# Patient Record
Sex: Female | Born: 1995 | Race: Black or African American | Hispanic: No | Marital: Single | Smoking: Never smoker
Health system: Southern US, Community
[De-identification: ages and names within clinical notes are randomized; demographics above are authoritative.]

---

## 2017-11-25 ENCOUNTER — Emergency Department (HOSPITAL_COMMUNITY)
Admission: EM | Admit: 2017-11-25 | Discharge: 2017-11-25 | Disposition: A | Discharge status: Home / Self Care | Payer: Self-pay | Attending: Emergency Medicine | Admitting: Emergency Medicine

## 2017-11-25 ENCOUNTER — Encounter (HOSPITAL_COMMUNITY): Payer: Self-pay | Admitting: *Deleted

## 2017-11-25 ENCOUNTER — Emergency Department (HOSPITAL_COMMUNITY): Payer: Self-pay

## 2017-11-25 DIAGNOSIS — R112 Nausea with vomiting, unspecified: Secondary | ICD-10-CM | POA: Insufficient documentation

## 2017-11-25 DIAGNOSIS — R1013 Epigastric pain: Secondary | ICD-10-CM | POA: Insufficient documentation

## 2017-11-25 DIAGNOSIS — R000 Tachycardia, unspecified: Secondary | ICD-10-CM | POA: Insufficient documentation

## 2017-11-25 DIAGNOSIS — R0602 Shortness of breath: Secondary | ICD-10-CM | POA: Insufficient documentation

## 2017-11-25 LAB — CBC WITH DIFFERENTIAL/PLATELET
Basophils Absolute: 0 10*3/uL (ref 0.0–0.1)
Basophils Relative: 0 %
Eosinophils Absolute: 0 10*3/uL (ref 0.0–0.7)
Eosinophils Relative: 0 %
HCT: 39.7 % (ref 36.0–46.0)
Hemoglobin: 14 g/dL (ref 12.0–15.0)
Lymphocytes Relative: 7 %
Lymphs Abs: 0.7 10*3/uL (ref 0.7–4.0)
MCH: 30.8 pg (ref 26.0–34.0)
MCHC: 35.3 g/dL (ref 30.0–36.0)
MCV: 87.4 fL (ref 78.0–100.0)
Monocytes Absolute: 0.2 10*3/uL (ref 0.1–1.0)
Monocytes Relative: 2 %
Neutro Abs: 8.8 10*3/uL — ABNORMAL HIGH (ref 1.7–7.7)
Neutrophils Relative %: 91 %
Platelets: 296 10*3/uL (ref 150–400)
RBC: 4.54 MIL/uL (ref 3.87–5.11)
RDW: 12.8 % (ref 11.5–15.5)
WBC: 9.7 10*3/uL (ref 4.0–10.5)

## 2017-11-25 LAB — COMPREHENSIVE METABOLIC PANEL
ALT: 32 U/L (ref 14–54)
AST: 49 U/L — ABNORMAL HIGH (ref 15–41)
Albumin: 5.2 g/dL — ABNORMAL HIGH (ref 3.5–5.0)
Alkaline Phosphatase: 52 U/L (ref 38–126)
Anion gap: 22 — ABNORMAL HIGH (ref 5–15)
BUN: 13 mg/dL (ref 6–20)
CO2: 14 mmol/L — ABNORMAL LOW (ref 22–32)
Calcium: 10.4 mg/dL — ABNORMAL HIGH (ref 8.9–10.3)
Chloride: 103 mmol/L (ref 101–111)
Creatinine, Ser: 0.71 mg/dL (ref 0.44–1.00)
GFR calc Af Amer: >60 mL/min (ref >60)
GFR calc non Af Amer: >60 mL/min (ref >60)
Glucose, Bld: 96 mg/dL (ref 65–99)
Potassium: 3.5 mmol/L (ref 3.5–5.1)
Sodium: 139 mmol/L (ref 135–145)
Total Bilirubin: 1.9 mg/dL — ABNORMAL HIGH (ref 0.3–1.2)
Total Protein: 8.9 g/dL — ABNORMAL HIGH (ref 6.5–8.1)

## 2017-11-25 LAB — I-STAT BETA HCG BLOOD, ED (MC, WL, AP ONLY): I-stat hCG, quantitative: <5 m[IU]/mL (ref <5)

## 2017-11-25 LAB — LIPASE, BLOOD: Lipase: 25 U/L (ref 11–51)

## 2017-11-25 MED ORDER — SODIUM CHLORIDE 0.9 % IV BOLUS
1000.0000 mL | Freq: Once | INTRAVENOUS | Status: AC
  Administered 2017-11-25: 1000 mL via INTRAVENOUS

## 2017-11-25 MED ORDER — LORAZEPAM 2 MG/ML IJ SOLN
0.5000 mg | Freq: Once | INTRAMUSCULAR | Status: AC
  Administered 2017-11-25: 0.5 mg via INTRAVENOUS
  Filled 2017-11-25: qty 1

## 2017-11-25 MED ORDER — ONDANSETRON 4 MG PO TBDP: 4.0000 mg | ORAL_TABLET | Freq: Three times a day (TID) | ORAL | 0 refills | Status: DC | PRN

## 2017-11-25 NOTE — ED Notes (Signed)
Pt denies pain, vss, a/o x4, ambulatory upon discharge.

## 2017-11-25 NOTE — Discharge Instructions (Signed)
As we discussed, we believe her symptoms are caused today by mild volume depletion, or mild dehydration, without any evidence of damage to your body.  Please drink plenty of clear fluids such as water and/or Gatorade and follow up with your regular doctor or the doctors listed in his documentation at the next available opportunity.  Return to the emergency department with any new or worsening symptoms that concern you, including but not limited to fever, shortness of breath, chest pain, or other concerning symptoms. ° ° °Dehydration, Adult °Dehydration is when you lose more fluids from the body than you take in. Vital organs like the kidneys, brain, and heart cannot function without a proper amount of fluids and salt. Any loss of fluids from the body can cause dehydration.  °CAUSES  °Vomiting. °Diarrhea. °Excessive sweating. °Excessive urine output. °Fever. °SYMPTOMS  °Mild dehydration °Thirst. °Dry lips. °Slightly dry mouth. °Moderate dehydration °Very dry mouth. °Sunken eyes. °Skin does not bounce back quickly when lightly pinched and released. °Dark urine and decreased urine production. °Decreased tear production. °Headache. °Severe dehydration °Very dry mouth. °Extreme thirst. °Rapid, weak pulse (more than 100 beats per minute at rest). °Cold hands and feet. °Not able to sweat in spite of heat and temperature. °Rapid breathing. °Blue lips. °Confusion and lethargy. °Difficulty being awakened. °Minimal urine production. °No tears. °DIAGNOSIS  °Your caregiver will diagnose dehydration based on your symptoms and your exam. Blood and urine tests will help confirm the diagnosis. The diagnostic evaluation should also identify the cause of dehydration. °TREATMENT  °Treatment of mild or moderate dehydration can often be done at home by increasing the amount of fluids that you drink. It is best to drink small amounts of fluid more often. Drinking too much at one time can make vomiting worse. Refer to the home care  instructions below. °Severe dehydration needs to be treated at the hospital where you will probably be given intravenous (IV) fluids that contain water and electrolytes. °HOME CARE INSTRUCTIONS  °Ask your caregiver about specific rehydration instructions. °Drink enough fluids to keep your urine clear or pale yellow. °Drink small amounts frequently if you have nausea and vomiting. °Eat as you normally do. °Avoid: °Foods or drinks high in sugar. °Carbonated drinks. °Juice. °Extremely hot or cold fluids. °Drinks with caffeine. °Fatty, greasy foods. °Alcohol. °Tobacco. °Overeating. °Gelatin desserts. °Wash your hands well to avoid spreading bacteria and viruses. °Only take over-the-counter or prescription medicines for pain, discomfort, or fever as directed by your caregiver. °Ask your caregiver if you should continue all prescribed and over-the-counter medicines. °Keep all follow-up appointments with your caregiver. °SEEK MEDICAL CARE IF: °You have abdominal pain and it increases or stays in one area (localizes). °You have a rash, stiff neck, or severe headache. °You are irritable, sleepy, or difficult to awaken. °You are weak, dizzy, or extremely thirsty. °SEEK IMMEDIATE MEDICAL CARE IF:  °You are unable to keep fluids down or you get worse despite treatment. °You have frequent episodes of vomiting or diarrhea. °You have blood or green matter (bile) in your vomit. °You have blood in your stool or your stool looks black and tarry. °You have not urinated in 6 to 8 hours, or you have only urinated a small amount of very dark urine. °You have a fever. °You faint. °MAKE SURE YOU:  °Understand these instructions. °Will watch your condition. °Will get help right away if you are not doing well or get worse. °Document Released: 05/30/2005 Document Revised: 08/22/2011 Document Reviewed: 01/17/2011 °ExitCare® Patient Information ©2015   [employer]. This information is not intended to replace advice given to you by your health  care provider. Make sure you discuss any questions you have with your health care provider. ° °Rehydration, Adult °Rehydration is the replacement of body fluids lost during dehydration. Dehydration is an extreme loss of body fluids to the point of body function impairment. There are many ways extreme fluid loss can occur, including vomiting, diarrhea, or excess sweating. Recovering from dehydration requires replacing lost fluids, continuing to eat to maintain strength, and avoiding foods and beverages that may contribute to further fluid loss or may increase nausea. °HOW TO REHYDRATE °In most cases, rehydration involves the replacement of not only fluids but also carbohydrates and basic body salts. Rehydration with an oral rehydration solution is one way to replace essential nutrients lost through dehydration. °An oral rehydration solution can be purchased at pharmacies, retail stores, and online. Premixed packets of powder that you combine with water to make a solution are also sold. You can prepare an oral rehydration solution at home by mixing the following ingredients together:  ° - tsp table salt. °¾ tsp baking soda. ° tsp salt substitute containing potassium chloride. °1 tablespoons sugar. °1 L (34 oz) of water. °Be sure to use exact measurements. Including too much sugar can make diarrhea worse. °Drink ½-1 cup (120-240 mL) of oral rehydration solution each time you have diarrhea or vomit. If drinking this amount makes your vomiting worse, try drinking smaller amounts more often. For example, drink 1-3 tsp every 5-10 minutes.  °A general rule for staying hydrated is to drink 1½-2 L of fluid per day. Talk to your caregiver about the specific amount you should be drinking each day. Drink enough fluids to keep your urine clear or pale yellow. °EATING WHEN DEHYDRATED °Even if you have had severe sweating or you are having diarrhea, do not stop eating. Many healthy items in a normal diet are okay to continue eating  while recovering from dehydration. The following tips can help you to lessen nausea when you eat: °Ask someone else to prepare your food. Cooking smells may worsen nausea. °Eat in a well-ventilated room away from cooking smells. °Sit up when you eat. Avoid lying down until 1-2 hours after eating. °Eat small amounts when you eat. °Eat foods that are easy to digest. These include soft, well-cooked, or mashed foods. °FOODS AND BEVERAGES TO AVOID °Avoid eating or drinking the following foods and beverages that may increase nausea or further loss of fluid:  °Fruit juices with a high sugar content, such as concentrated juices. °Alcohol. °Beverages containing caffeine. °Carbonated drinks. They may cause a lot of gas. °Foods that may cause a lot of gas, such as cabbage, broccoli, and beans. °Fatty, greasy, and fried foods. °Spicy, very salty, and very sweet foods or drinks. °Foods or drinks that are very hot or very cold. Consume food or drinks at or near room temperature. °Foods that need a lot of chewing, such as raw vegetables. °Foods that are sticky or hard to swallow, such as peanut butter. °Document Released: 08/22/2011 Document Revised: 02/22/2012 Document Reviewed: 08/22/2011 °ExitCare® Patient Information ©2015 [employer]. This information is not intended to replace advice given to you by your health care provider. Make sure you discuss any questions you have with your health care provider. ° ° ° °

## 2017-11-25 NOTE — ED Provider Notes (Signed)
Emergency Department Provider Note   I have reviewed the triage vital signs and the nursing notes.   HISTORY  Chief Complaint Emesis and Abdominal Pain   HPI Misty Campbell is a 22 y.o. female with no significant PMH resents to the emergency department for evaluation of nausea, vomiting, epigastric abdominal pain, and dyspnea.  Symptoms began this morning.  Patient was out drinking heavily last night.  She states she normally does not drink that heavily but this morning was very nauseated and vomiting.  After several episodes of emesis she began having shortness of breath.  She denies chest pain.  No blood in the vomit. No radiation of symptoms or modifying factors.    History reviewed. No pertinent past medical history.  There are no active problems to display for this patient.   History reviewed. No pertinent surgical history.    Allergies Other  No family history on file.  Social History Social History   Tobacco Use  . Smoking status: Not on file  Substance Use Topics  . Alcohol use: Yes  . Drug use: Not on file    Review of Systems  Constitutional: No fever/chills Eyes: No visual changes. ENT: No sore throat. Cardiovascular: Denies chest pain. Respiratory: Positive shortness of breath. Gastrointestinal: Positive epigastric abdominal pain. Positive nausea and vomiting.  No diarrhea.  No constipation. Genitourinary: Negative for dysuria. Musculoskeletal: Negative for back pain. Skin: Negative for rash. Neurological: Negative for headaches, focal weakness or numbness.  10-point ROS otherwise negative.  ____________________________________________   PHYSICAL EXAM:  VITAL SIGNS: ED Triage Vitals [11/25/17 1342]  Enc Vitals Group     BP (!) 145/77     Pulse Rate (!) 150     Resp (!) 22     Temp 97.9 F (36.6 C)     Temp Source Oral     SpO2 100 %    Constitutional: Alert and oriented. Well appearing but breathing rapidly.  Eyes: Conjunctivae are  normal.  Head: Atraumatic. Nose: No congestion/rhinnorhea. Mouth/Throat: Mucous membranes are moist. Oropharynx non-erythematous. Neck: No stridor. Cardiovascular: Sinus tachycardia. Good peripheral circulation. Grossly normal heart sounds.   Respiratory: Increased respiratory effort.  No retractions. Lungs CTAB. Gastrointestinal: Soft and nontender. No distention.  Musculoskeletal: No lower extremity tenderness nor edema. No gross deformities of extremities. Neurologic:  Normal speech and language. No gross focal neurologic deficits are appreciated.  Skin:  Skin is warm, dry and intact. No rash noted.  ____________________________________________   LABS (all labs ordered are listed, but only abnormal results are displayed)  Labs Reviewed  CBC WITH DIFFERENTIAL/PLATELET - Abnormal; Notable for the following components:      Result Value   Neutro Abs 8.8 (*)    All other components within normal limits  COMPREHENSIVE METABOLIC PANEL - Abnormal; Notable for the following components:   CO2 14 (*)    Calcium 10.4 (*)    Total Protein 8.9 (*)    Albumin 5.2 (*)    AST 49 (*)    Total Bilirubin 1.9 (*)    Anion gap 22 (*)    All other components within normal limits  LIPASE, BLOOD  I-STAT BETA HCG BLOOD, ED ([facility], [facility], [facility] ONLY)   ____________________________________________  EKG   EKG Interpretation  Date/Time:  Saturday November 25 2017 14:05:39 EDT Ventricular Rate:  145 PR Interval:    QRS Duration: 111 QT Interval:  339 QTC Calculation: 527 R Axis:   109 Text Interpretation:  Sinus tachycardia Borderline right axis deviation  Low voltage, precordial leads Minimal ST depression, inferior leads Prolonged QT interval Confirmed by Alona Bene [phone]) on 11/25/2017 2:31:35 PM Also confirmed by Alona Bene [phone]), editor Barbette Hair [phone])  on 11/25/2017 3:41:20 PM       ____________________________________________  RADIOLOGY  Dg Chest 2 View  Result Date:  11/25/2017 CLINICAL DATA:  22 year old female with a history of tachycardia EXAM: CHEST - 2 VIEW COMPARISON:  None. FINDINGS: The heart size and mediastinal contours are within normal limits. Both lungs are clear. The visualized skeletal structures are unremarkable. IMPRESSION: Negative for acute cardiopulmonary disease. Electronically Signed   By: Gilmer Mor D.O.   On: 11/25/2017 14:43    ____________________________________________   PROCEDURES  Procedure(s) performed:   Procedures  None ____________________________________________   INITIAL IMPRESSION / ASSESSMENT AND PLAN / ED COURSE  Pertinent labs & imaging results that were available during my care of the patient were reviewed by me and considered in my medical decision making (see chart for details).  Patient presents to the emergency department for evaluation of difficulty breathing in the setting of nausea and vomiting.  Patient was out drinking last night but denies any other known substance use.  Patient has sinus tachycardia on arrival but normal blood pressure.  Plan for IV fluids, low-dose Ativan, chest x-ray, labs, and reassess. Very low suspicion for ACS/PE.   Vitals including HR are down-trending. No hypoxemia. Patient on [place] O2 for comfort only. No hypoxemia. Patient is not dyspneic on my reassessment. Plan to continue PO hydration at home. Discharging with Zofran.   I have reviewed and discussed all results (EKG, imaging, lab, urine as appropriate), exam findings with patient. I have reviewed nursing notes and appropriate previous records.  I feel the patient is safe to be discharged home without further emergent workup. Discussed usual and customary return precautions. Patient and family (if present) verbalize understanding and are comfortable with this plan.  Patient will follow-up with their primary care provider. If they do not have a primary care provider, information for follow-up has been provided to them. All  questions have been answered.  ____________________________________________  FINAL CLINICAL IMPRESSION(S) / ED DIAGNOSES  Final diagnoses:  Non-intractable vomiting with nausea, unspecified vomiting type  Epigastric abdominal pain  Shortness of breath     MEDICATIONS GIVEN DURING THIS VISIT:  Medications  sodium chloride 0.9 % bolus 1,000 mL (0 mLs Intravenous Stopped 11/25/17 1534)  LORazepam (ATIVAN) injection 0.5 mg (0.5 mg Intravenous Given 11/25/17 1419)     NEW OUTPATIENT MEDICATIONS STARTED DURING THIS VISIT:  Discharge Medication List as of 11/25/2017  3:34 PM    START taking these medications   Details  ondansetron (ZOFRAN ODT) 4 MG disintegrating tablet Take 1 tablet (4 mg total) by mouth every 8 (eight) hours as needed for nausea or vomiting., Starting Sat 11/25/2017, Print        Note:  This document was prepared using Dragon voice recognition software and may include unintentional dictation errors.  Alona Bene, MD Emergency Medicine    Octaviano Mukai, Arlyss Repress, MD 11/25/17 [phone]

## 2017-11-25 NOTE — ED Triage Notes (Signed)
Pt complains of nausea, vomiting, abd pain since this morning. Pt denies diarrhea. Pt states she drank more alcohol than she is used to last night.

## 2020-09-04 ENCOUNTER — Emergency Department (HOSPITAL_COMMUNITY): Payer: BLUE CROSS/BLUE SHIELD

## 2020-09-04 ENCOUNTER — Encounter (HOSPITAL_COMMUNITY): Payer: Self-pay | Admitting: Emergency Medicine

## 2020-09-04 ENCOUNTER — Emergency Department (HOSPITAL_COMMUNITY)
Admission: EM | Admit: 2020-09-04 | Discharge: 2020-09-05 | Disposition: A | Discharge status: Home / Self Care | Payer: BLUE CROSS/BLUE SHIELD | Attending: Emergency Medicine | Admitting: Emergency Medicine

## 2020-09-04 DIAGNOSIS — X58XXXA Exposure to other specified factors, initial encounter: Secondary | ICD-10-CM | POA: Diagnosis not present

## 2020-09-04 DIAGNOSIS — Y92830 Public park as the place of occurrence of the external cause: Secondary | ICD-10-CM | POA: Diagnosis not present

## 2020-09-04 DIAGNOSIS — M25579 Pain in unspecified ankle and joints of unspecified foot: Secondary | ICD-10-CM

## 2020-09-04 DIAGNOSIS — S82841A Displaced bimalleolar fracture of right lower leg, initial encounter for closed fracture: Secondary | ICD-10-CM | POA: Insufficient documentation

## 2020-09-04 DIAGNOSIS — S99911A Unspecified injury of right ankle, initial encounter: Secondary | ICD-10-CM | POA: Diagnosis present

## 2020-09-04 DIAGNOSIS — Y9389 Activity, other specified: Secondary | ICD-10-CM | POA: Diagnosis not present

## 2020-09-04 MED ORDER — OXYCODONE-ACETAMINOPHEN 5-325 MG PO TABS: 1.0000 | ORAL_TABLET | Freq: Four times a day (QID) | ORAL | 0 refills | Status: DC | PRN

## 2020-09-04 MED ORDER — OXYCODONE-ACETAMINOPHEN 5-325 MG PO TABS
1.0000 | ORAL_TABLET | Freq: Once | ORAL | Status: AC
  Administered 2020-09-04: 1 via ORAL
  Filled 2020-09-04: qty 1

## 2020-09-04 NOTE — ED Notes (Signed)
Ortho tech paged  

## 2020-09-04 NOTE — ED Triage Notes (Signed)
Pt BIB EMS [place]ampoline park. Pt landing wrong on right ankle. Deformity and swelling noted. 20G LAC. 100 mcg Fentanyl given en route.

## 2020-09-04 NOTE — Discharge Instructions (Signed)
You were seen today for an ankle injury.  You have a fractured ankle.  Follow-up with orthopedics.  Do not bear weight.  Take pain medication as prescribed.  Call for an orthopedics appointment on Monday.

## 2020-09-04 NOTE — ED Provider Notes (Signed)
[facility]-EMERGENCY DEPT Provider Note   CSN: [id number] Arrival date & time: 09/04/20  2100     History Chief Complaint  Patient presents with  . Ankle Pain    Misty Campbell is a 25 y.o. female.  HPI     This is a 24 year old female who presents with right ankle injury.  Patient reports that she was at a trampoline park when she landed wrong on her right foot.  She has been unable to bear weight.  Swelling and deformity noted by EMS.  She was given fentanyl in route and currently states that her pain is well controlled.  Denies numbness or tingling.  Denies other injury.  Denies knee pain.  History reviewed. No pertinent past medical history.  There are no problems to display for this patient.   History reviewed. No pertinent surgical history.   OB History   No obstetric history on file.     No family history on file.  Social History   Substance Use Topics  . Alcohol use: Yes    Home Medications Prior to Admission medications   Medication Sig Start Date End Date Taking? Authorizing Provider  oxyCODONE-acetaminophen (PERCOCET/ROXICET) 5-325 MG tablet Take 1 tablet by mouth every 6 (six) hours as needed for severe pain. 09/04/20  Yes Anisha Starliper, Mayer Masker, MD  ondansetron (ZOFRAN ODT) 4 MG disintegrating tablet Take 1 tablet (4 mg total) by mouth every 8 (eight) hours as needed for nausea or vomiting. Patient not taking: Reported on 09/04/2020 11/25/17   Long, Arlyss Repress, MD    Allergies    Other  Review of Systems   Review of Systems  Musculoskeletal:       Right ankle pain  Neurological: Negative for weakness and numbness.  All other systems reviewed and are negative.   Physical Exam Updated Vital Signs BP 118/73 (BP Location: Left Arm)   Pulse (!) 101   Temp 98.7 F (37.1 C) (Oral)   Resp 16   Ht 1.613 m (5' 3.5")   Wt 50.8 kg   LMP 08/18/2020 (Exact Date)   SpO2 100%   BMI 19.53 kg/m   Physical Exam Vitals and nursing note  reviewed.  Constitutional:      Appearance: She is well-developed. She is not ill-appearing.  HENT:     Head: Normocephalic and atraumatic.  Eyes:     Pupils: Pupils are equal, round, and reactive to light.  Cardiovascular:     Rate and Rhythm: Normal rate and regular rhythm.  Pulmonary:     Effort: Pulmonary effort is normal. No respiratory distress.  Abdominal:     Palpations: Abdomen is soft.     Tenderness: There is no abdominal tenderness.  Musculoskeletal:     Cervical back: Neck supple.     Comments: Swelling and deformity noted to the right ankle, foot is slightly inverted without obvious dislocation, 2+ DP pulse, no proximal fibular tenderness, neurovascular intact distally  Skin:    General: Skin is warm and dry.  Neurological:     Mental Status: She is alert and oriented to person, place, and time.  Psychiatric:        Mood and Affect: Mood normal.     ED Results / Procedures / Treatments   Labs (all labs ordered are listed, but only abnormal results are displayed) Labs Reviewed - No data to display  EKG None  Radiology DG Ankle Right Port  Result Date: 09/04/2020 CLINICAL DATA:  Right ankle pain after trampoline  park injury. EXAM: PORTABLE RIGHT ANKLE - 2 VIEW COMPARISON:  None. FINDINGS: Displaced distal fibular fracture at the level of the ankle mortise. There is mild apex lateral angulation. There is a vertically oriented fracture through the distal medial tibia involving the medial malleolus. Fracture extends to the tibial talar joint space. There is mild medial talar subluxation. Moderate surrounding soft tissue edema. IMPRESSION: 1. Displaced and angulated distal fibular fracture at the level of the ankle mortise. 2. Vertically oriented and mildly displaced distal tibial fracture involves the medial malleolus and extends to the tibiotalar joint space. 3. Mild medial talar subluxation. Electronically Signed   By: Narda Rutherford M.D.   On: 09/04/2020 21:31     Procedures Procedures   Medications Ordered in ED Medications  oxyCODONE-acetaminophen (PERCOCET/ROXICET) 5-325 MG per tablet 1 tablet (1 tablet Oral Given 09/04/20 2315)    ED Course  I have reviewed the triage vital signs and the nursing notes.  Pertinent labs & imaging results that were available during my care of the patient were reviewed by me and considered in my medical decision making (see chart for details).  Clinical Course as of 09/04/20 2344  Fri Sep 04, 2020  2156 Spoke with Dr. Jena Gauss over the phone.  I independently reviewed the films.  She at least has a by mouth fracture.  No obvious dislocation but will await formal radiology read.  We will plan for splinting and follow-up with nonweightbearing. [CH]    Clinical Course User Index [CH] Wendle Kina, Mayer Masker, MD   MDM Rules/Calculators/A&P                          Patient presents with ankle injury.  Neurovascularly intact.  Slight internal rotation of right foot but no obvious dislocation.  X-ray reviewed by myself and shows a bimalleolar fracture.  Will await official read.  Official read confirmed bimalleolar fracture.  There is slight subluxation.  Awaiting orthopedic tech to splint.  Patient was told to be nonweightbearing and follow-up with Dr. Jena Gauss.  After history, exam, and medical workup I feel the patient has been appropriately medically screened and is safe for discharge home. Pertinent diagnoses were discussed with the patient. Patient was given return precautions.  Final Clinical Impression(s) / ED Diagnoses Final diagnoses:  Ankle pain  Closed bimalleolar fracture of right ankle, initial encounter    Rx / DC Orders ED Discharge Orders         Ordered    oxyCODONE-acetaminophen (PERCOCET/ROXICET) 5-325 MG tablet  Every 6 hours PRN        09/04/20 2333           Shon Baton, MD 09/04/20 2345

## 2020-09-05 NOTE — Progress Notes (Signed)
Orthopedic Tech Progress Note Patient Details:  Misty Campbell Aug 23, 1995 [id number]  Ortho Devices Type of Ortho Device: Post (short leg) splint,Stirrup splint,Crutches Ortho Device/Splint Location: Right Leg Ortho Device/Splint Interventions: Application   Post Interventions Patient Tolerated: Well Instructions Provided: Care of device,Poper ambulation with device   Vinicius Brockman [name] 09/05/2020, 12:28 AM

## 2020-12-08 ENCOUNTER — Encounter: Payer: Self-pay | Admitting: Plastic Surgery

## 2020-12-08 ENCOUNTER — Other Ambulatory Visit: Payer: Self-pay

## 2020-12-08 ENCOUNTER — Ambulatory Visit (INDEPENDENT_AMBULATORY_CARE_PROVIDER_SITE_OTHER): Payer: BLUE CROSS/BLUE SHIELD | Admitting: Plastic Surgery

## 2020-12-08 DIAGNOSIS — S91301A Unspecified open wound, right foot, initial encounter: Secondary | ICD-10-CM | POA: Diagnosis not present

## 2020-12-08 NOTE — Progress Notes (Signed)
     Patient ID: Misty Campbell, female    DOB: 26-Apr-1996, 25 y.o.   MRN: [id number]   Chief Complaint  Patient presents with   Advice Only   Skin Problem    The patient is a 25 year old female here for evaluation of her right foot wound.  She had a fracture of her right ankle approximately 3 months ago.  She had a blister.  She then developed a wound on the anterior aspect of her right foot.  She does not have any hardware exposed.  There was a little bit of eschar I was able to debride.  Overall it looks clean and does not appear to be overtly infected.  She is using Xeroform on it.  She works on her feet and has been quite busy.  The pain is controllable.  The wound is approximately 2.5 x 2 cm.   Review of Systems  Constitutional:  Negative for appetite change.  HENT: Negative.    Eyes: Negative.   Respiratory: Negative.  Negative for chest tightness.   Cardiovascular: Negative.   Gastrointestinal: Negative.   Genitourinary: Negative.   Musculoskeletal: Negative.   Skin:  Positive for wound.  Neurological: Negative.   Hematological: Negative.   Psychiatric/Behavioral: Negative.     History reviewed. No pertinent past medical history.  History reviewed. No pertinent surgical history.    Current Outpatient Medications:    ondansetron (ZOFRAN ODT) 4 MG disintegrating tablet, Take 1 tablet (4 mg total) by mouth every 8 (eight) hours as needed for nausea or vomiting., Disp: 20 tablet, Rfl: 0   oxyCODONE-acetaminophen (PERCOCET/ROXICET) 5-325 MG tablet, Take 1 tablet by mouth every 6 (six) hours as needed for severe pain. (Patient not taking: Reported on 12/08/2020), Disp: 10 tablet, Rfl: 0   Objective:   Vitals:   12/08/20 0931  BP: (!) 137/97  Pulse: (!) 117  SpO2: 99%    Physical Exam Vitals and nursing note reviewed.  Constitutional:      Appearance: Normal appearance.  HENT:     Head: Normocephalic and atraumatic.  Cardiovascular:     Rate and Rhythm: Normal rate.      Pulses: Normal pulses.  Pulmonary:     Effort: Pulmonary effort is normal.  Musculoskeletal:        General: Swelling and deformity present.  Skin:    Capillary Refill: Capillary refill takes less than 2 seconds.     Findings: Bruising, erythema and lesion present.  Neurological:     General: No focal deficit present.     Mental Status: She is alert.    Assessment & Plan:  Open wound of right foot, initial encounter  Donated ACell was applied.  The patient is to use [place] gel daily.  Do not get it wet until Saturday.  Continue with Xeroform or [place] after that.  I would like to see her back in a week.  Pictures were obtained of the patient and placed in the chart with the patient's or guardian's permission.   Alena Bills Jeanae Whitmill, DO

## 2020-12-15 ENCOUNTER — Ambulatory Visit (INDEPENDENT_AMBULATORY_CARE_PROVIDER_SITE_OTHER): Payer: BLUE CROSS/BLUE SHIELD | Admitting: Plastic Surgery

## 2020-12-15 ENCOUNTER — Encounter: Payer: Self-pay | Admitting: Plastic Surgery

## 2020-12-15 ENCOUNTER — Other Ambulatory Visit: Payer: Self-pay

## 2020-12-15 DIAGNOSIS — S91301A Unspecified open wound, right foot, initial encounter: Secondary | ICD-10-CM

## 2020-12-15 NOTE — Progress Notes (Signed)
   Subjective:    Patient ID: Misty Campbell, female    DOB: 11-05-1995, 25 y.o.   MRN: [id number]  The patient is a 25 year old female here for follow-up on her right foot wound.  She is very pleased with her progress.  She had been taking a sulfa drug and had a very bad skin reaction to it so she stopped.  They gave her some amoxicillin and she asked me whether or not she should take it.  I think it would be a good idea.  If not she should check with her orthopedist the area looks markedly improved.  It has filled in very nicely.  The redness has resolved the swelling is improving.   Review of Systems  Constitutional:  Positive for activity change.  Eyes: Negative.   Respiratory: Negative.    Cardiovascular: Negative.   Gastrointestinal: Negative.   Endocrine: Negative.   Genitourinary: Negative.   Hematological: Negative.       Objective:   Physical Exam Nursing note reviewed.  Constitutional:      Appearance: Normal appearance.  HENT:     Head: Normocephalic.  Cardiovascular:     Rate and Rhythm: Normal rate.     Pulses: Normal pulses.  Skin:    Capillary Refill: Capillary refill takes less than 2 seconds.  Neurological:     Mental Status: She is alert and oriented to person, place, and time. Mental status is at baseline.  Psychiatric:        Mood and Affect: Mood normal.        Behavior: Behavior normal.        Thought Content: Thought content normal.        Assessment & Plan:     ICD-10-CM   1. Open wound of right foot, initial encounter  S91.301A      Donated ACell powder was applied.  Continue with KY jelly daily.  I would like to see her back in 10 days to 2 weeks. Pictures were obtained of the patient and placed in the chart with the patient's or guardian's permission.

## 2020-12-25 ENCOUNTER — Other Ambulatory Visit: Payer: Self-pay

## 2020-12-25 ENCOUNTER — Encounter: Payer: Self-pay | Admitting: Plastic Surgery

## 2020-12-25 ENCOUNTER — Ambulatory Visit (INDEPENDENT_AMBULATORY_CARE_PROVIDER_SITE_OTHER): Payer: BLUE CROSS/BLUE SHIELD | Admitting: Plastic Surgery

## 2020-12-25 DIAGNOSIS — S91301A Unspecified open wound, right foot, initial encounter: Secondary | ICD-10-CM

## 2020-12-25 NOTE — Progress Notes (Signed)
   Subjective:    Patient ID: Misty Campbell, female    DOB: August 06, 1995, 25 y.o.   MRN: [id number]  The patient is a 8 old female here for follow-up on her right foot wound.  She had a fracture of the area and then had some infection and trouble healing.  We have been using donated ACell.  She is doing extremely well.  The swelling has markedly improved.  There does not appear to be any infection.  There is no drainage.  There is a little fibrous tissue but     Review of Systems  Constitutional: Negative.   HENT: Negative.    Eyes: Negative.   Respiratory: Negative.  Negative for chest tightness.   Cardiovascular: Negative.   Gastrointestinal: Negative.   Endocrine: Negative.   Genitourinary: Negative.   Neurological: Negative.       Objective:   Physical Exam Vitals and nursing note reviewed.  Constitutional:      Appearance: Normal appearance.  HENT:     Head: Normocephalic and atraumatic.  Cardiovascular:     Rate and Rhythm: Normal rate.     Pulses: Normal pulses.  Musculoskeletal:        General: Signs of injury present. No swelling, tenderness or deformity.  Skin:    General: Skin is warm.     Capillary Refill: Capillary refill takes less than 2 seconds.     Coloration: Skin is not jaundiced.     Findings: Bruising present. No lesion.  Neurological:     Mental Status: She is alert.        Assessment & Plan:   No diagnosis found.  Recommend transitioning to Vaseline during the evening and silver socks during the day.  Patient agrees with this plan.  Follow back in 2 to 3 weeks.

## 2021-01-15 ENCOUNTER — Ambulatory Visit: Payer: BLUE CROSS/BLUE SHIELD | Admitting: Surgical

## 2021-01-29 ENCOUNTER — Ambulatory Visit (INDEPENDENT_AMBULATORY_CARE_PROVIDER_SITE_OTHER): Payer: BLUE CROSS/BLUE SHIELD | Admitting: Surgical

## 2021-01-29 ENCOUNTER — Other Ambulatory Visit: Payer: Self-pay

## 2021-01-29 ENCOUNTER — Encounter: Payer: Self-pay | Admitting: Surgical

## 2021-01-29 DIAGNOSIS — S91301A Unspecified open wound, right foot, initial encounter: Secondary | ICD-10-CM

## 2021-01-29 NOTE — Progress Notes (Signed)
   Referring Provider No referring provider defined for this encounter.   CC:  Chief Complaint  Patient presents with   Follow-up      Misty Campbell is an 25 y.o. female.  HPI: Patient is a 25 year old female here for follow-up on her right foot wound.  She had a fracture of the area and subsequent infection and trouble healing.  She has previously been evaluated by Dr. Ulice Bold and has been using donated ACell.  She reports today that she is doing much better.  She has some questions about ongoing care.  Review of Systems General: No fevers or chills Skin: No wound  Physical Exam Vitals with BMI 12/08/2020 09/05/2020 09/04/2020  Height 5\' 4"  - -  Weight - - -  BMI - - -  Systolic 137 122  Diastolic 97 76 78  Pulse 117 86 87    General:  No acute distress,  Alert and oriented, Non-Toxic, Normal speech and affect Right foot: Right foot wound with complete epithelialization noted.  No surrounding erythema.  New pigmentation noted throughout.  No foul odors.  No tenderness to palpation.  Palpable pulses noted.  No swelling noted.  Good sensation intact distally.  Assessment/Plan 25 year old female with a foot wound after surgery.  This has completely healed.  There is no sign of infection on exam.  Recommend 1 more week of avoiding submerging area in water.  No restrictions otherwise.  We did discuss pigment returning over the next 3 to 6 months, we did discuss that some areas may remain pink in color.  We also discussed using sunscreen to help prevent discoloration from sun. Picture taken and placed in patient's chart with patient permission   22 01/29/2021, 12:52 PM

## 2022-11-03 IMAGING — DX DG ANKLE PORT 2V*R*
3 series · 3 of 3 positions shown · non-contrast
Comparison: None.

CLINICAL DATA: Right ankle pain after trampoline park injury.

EXAM:
PORTABLE RIGHT ANKLE - 2 VIEW

[ankle ap (1 of 2)]
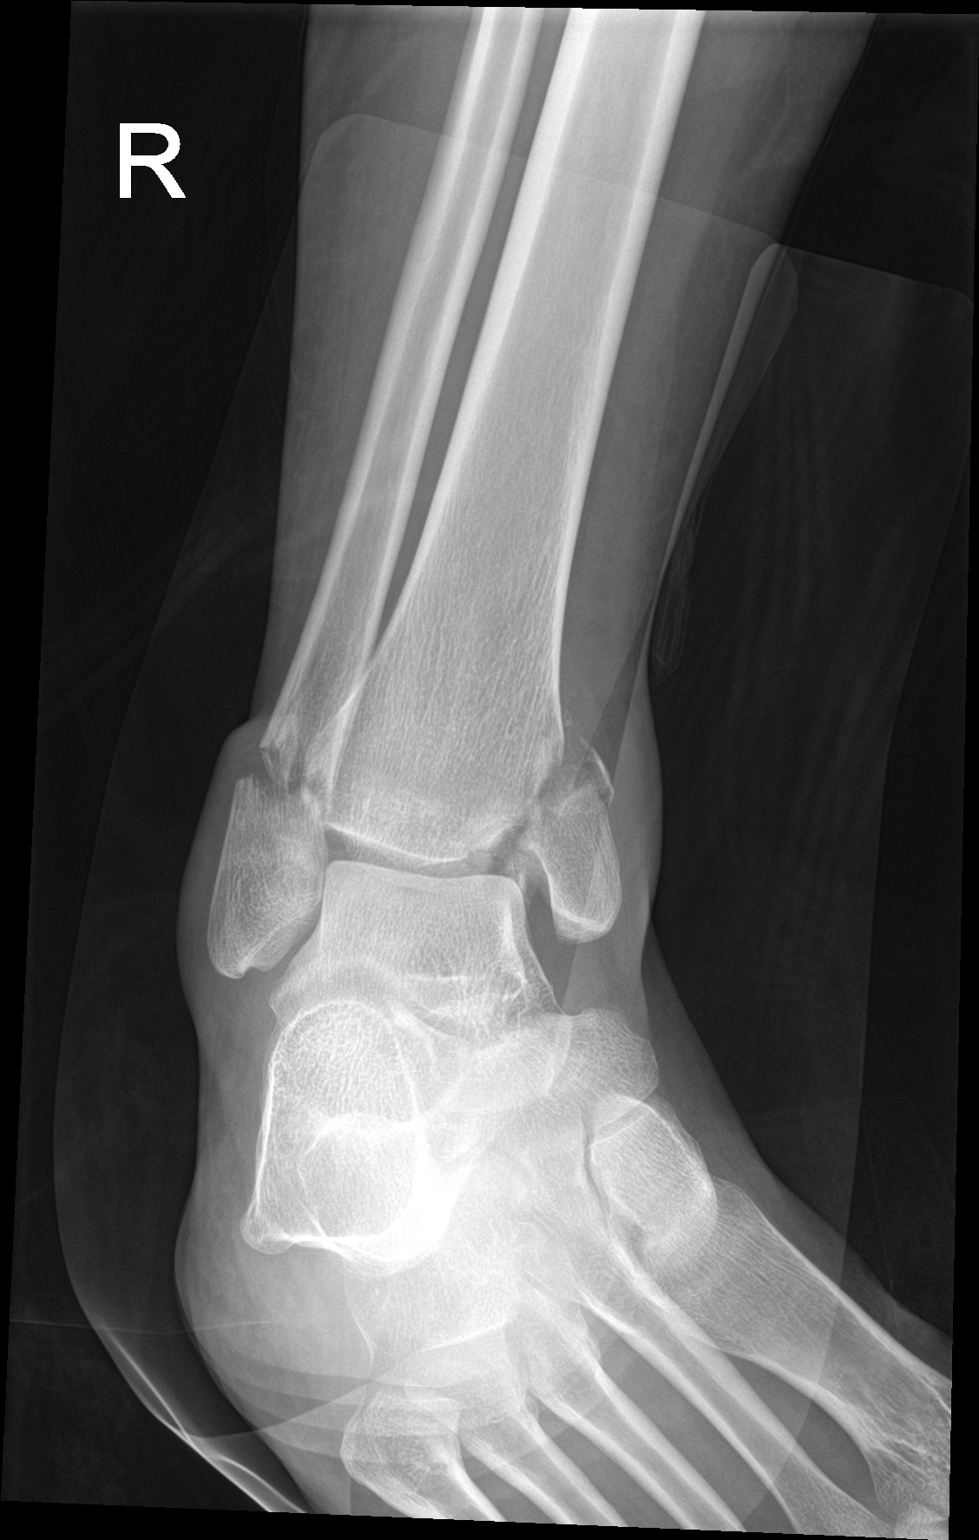

[ankle lat]
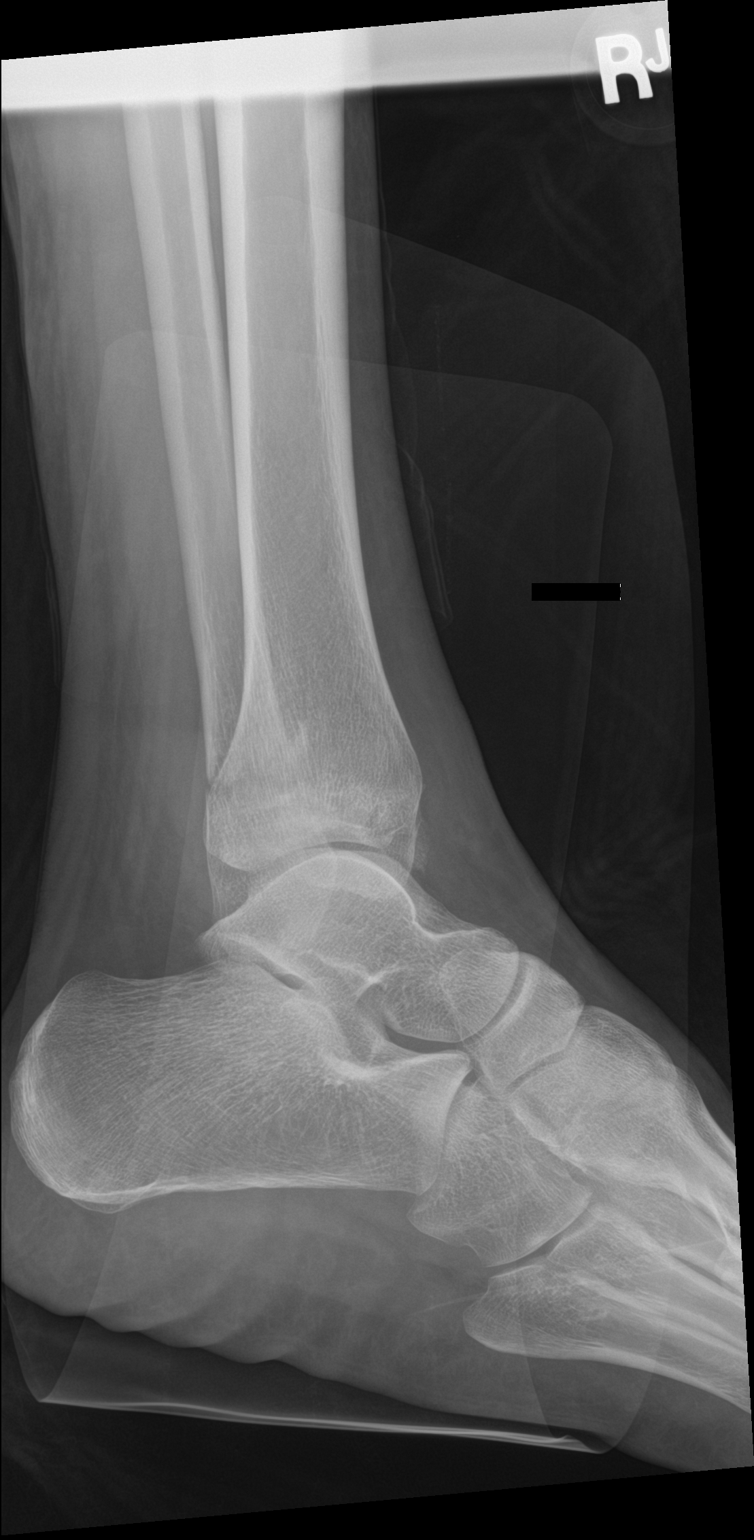

[ankle ap (2 of 2)]
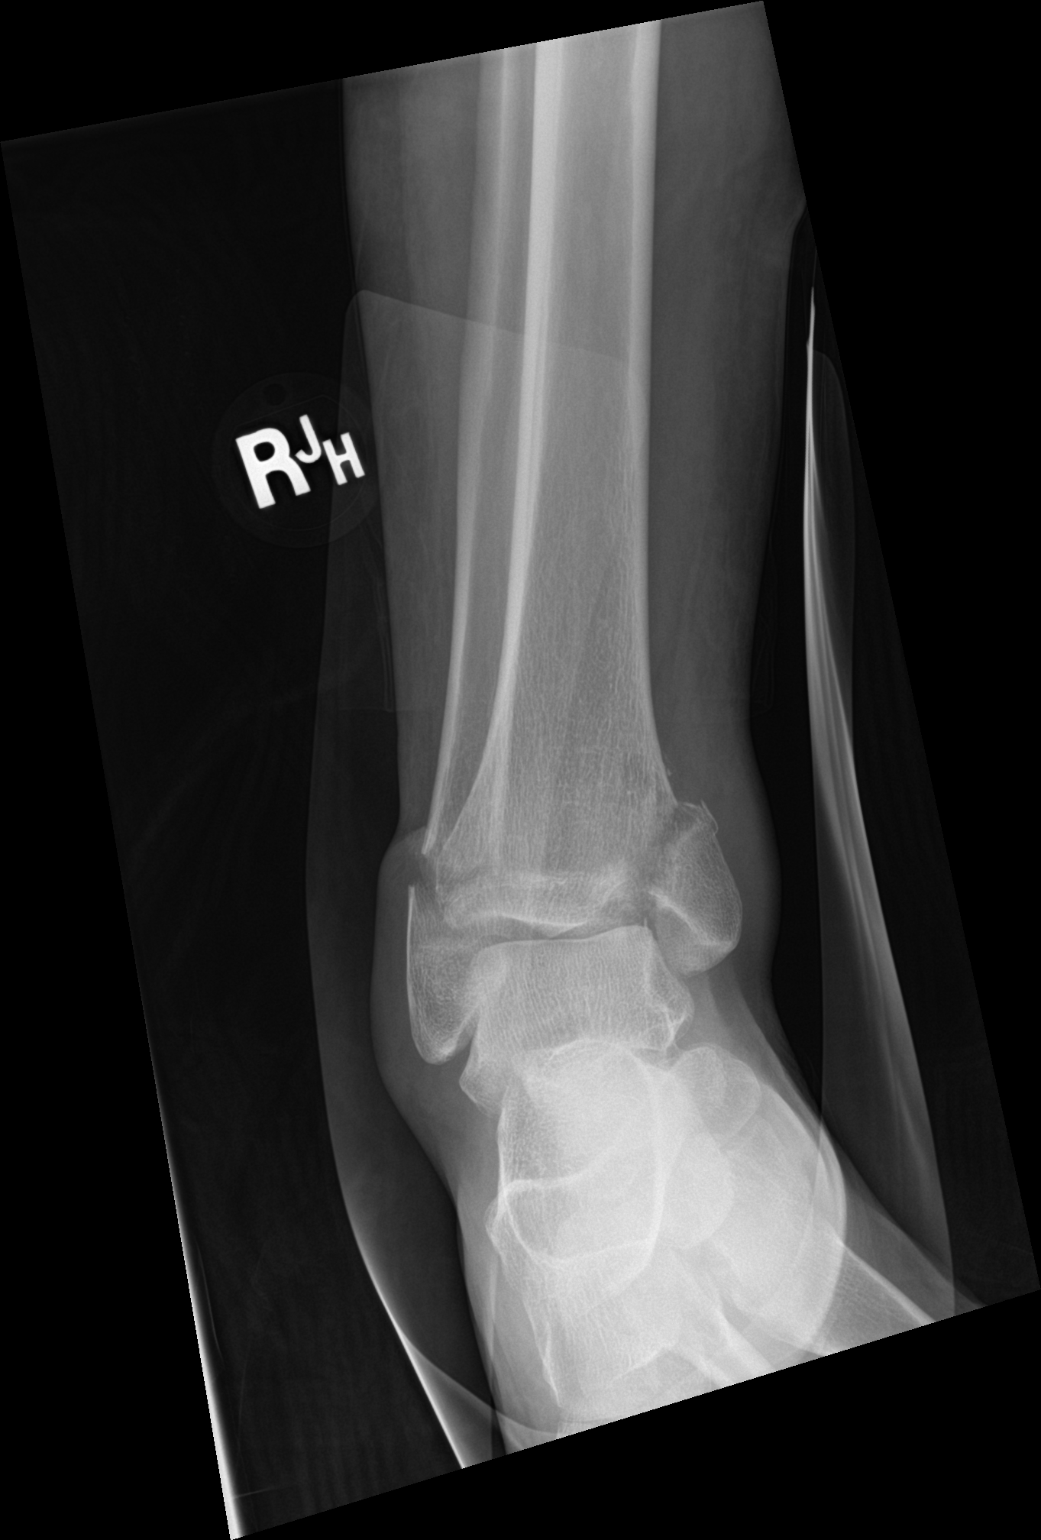

[3 of 3 positions shown; findings below may reference images not displayed]

FINDINGS: Displaced distal fibular fracture at the level of the ankle mortise.
There is mild apex lateral angulation. There is a vertically
oriented fracture through the distal medial tibia involving the
medial malleolus. Fracture extends to the tibial talar joint space.
There is mild medial talar subluxation. Moderate surrounding soft
tissue edema.
IMPRESSION: 1. Displaced and angulated distal fibular fracture at the level of
the ankle mortise.
2. Vertically oriented and mildly displaced distal tibial fracture
involves the medial malleolus and extends to the tibiotalar joint
space.
3. Mild medial talar subluxation.
